# Patient Record
Sex: Female | Born: 1955
Health system: Southern US, Community
[De-identification: ages and names within clinical notes are randomized; demographics above are authoritative.]

## PROBLEM LIST (undated history)

## (undated) DIAGNOSIS — J189 Pneumonia, unspecified organism: Secondary | ICD-10-CM

## (undated) HISTORY — PX: NEPHRECTOMY: SHX65

## (undated) HISTORY — PX: EYE SURGERY: SHX253

---

## 2018-01-31 ENCOUNTER — Other Ambulatory Visit: Payer: Self-pay

## 2018-01-31 ENCOUNTER — Emergency Department (HOSPITAL_BASED_OUTPATIENT_CLINIC_OR_DEPARTMENT_OTHER)
Admission: EM | Admit: 2018-01-31 | Discharge: 2018-01-31 | Disposition: A | Discharge status: Home / Self Care | Payer: BC Managed Care – PPO | Attending: Physician Assistant | Admitting: Physician Assistant

## 2018-01-31 ENCOUNTER — Emergency Department (HOSPITAL_BASED_OUTPATIENT_CLINIC_OR_DEPARTMENT_OTHER): Payer: BC Managed Care – PPO

## 2018-01-31 ENCOUNTER — Encounter (HOSPITAL_BASED_OUTPATIENT_CLINIC_OR_DEPARTMENT_OTHER): Payer: Self-pay | Admitting: Emergency Medicine

## 2018-01-31 DIAGNOSIS — R071 Chest pain on breathing: Secondary | ICD-10-CM | POA: Insufficient documentation

## 2018-01-31 DIAGNOSIS — R079 Chest pain, unspecified: Secondary | ICD-10-CM | POA: Diagnosis present

## 2018-01-31 DIAGNOSIS — R0781 Pleurodynia: Secondary | ICD-10-CM

## 2018-01-31 HISTORY — DX: Pneumonia, unspecified organism: J18.9

## 2018-01-31 LAB — CBC WITH DIFFERENTIAL/PLATELET
Basophils Absolute: 0 10*3/uL (ref 0.0–0.1)
Basophils Relative: 1 %
Eosinophils Absolute: 0.2 10*3/uL (ref 0.0–0.7)
Eosinophils Relative: 3 %
HEMATOCRIT: 42.1 % (ref 36.0–46.0)
HEMOGLOBIN: 14.3 g/dL (ref 12.0–15.0)
LYMPHS ABS: 2 10*3/uL (ref 0.7–4.0)
Lymphocytes Relative: 32 %
MCH: 31.2 pg (ref 26.0–34.0)
MCHC: 34 g/dL (ref 30.0–36.0)
MCV: 91.7 fL (ref 78.0–100.0)
Monocytes Absolute: 0.7 10*3/uL (ref 0.1–1.0)
Monocytes Relative: 11 %
NEUTROS ABS: 3.4 10*3/uL (ref 1.7–7.7)
NEUTROS PCT: 53 %
Platelets: 238 10*3/uL (ref 150–400)
RBC: 4.59 MIL/uL (ref 3.87–5.11)
RDW: 12.8 % (ref 11.5–15.5)
WBC: 6.3 10*3/uL (ref 4.0–10.5)

## 2018-01-31 LAB — BASIC METABOLIC PANEL
Anion gap: 5 (ref 5–15)
BUN: 23 mg/dL — AB (ref 6–20)
CHLORIDE: 105 mmol/L (ref 101–111)
CO2: 26 mmol/L (ref 22–32)
Calcium: 9.5 mg/dL (ref 8.9–10.3)
Creatinine, Ser: 0.77 mg/dL (ref 0.44–1.00)
GFR calc Af Amer: >60 mL/min (ref >60)
Glucose, Bld: 95 mg/dL (ref 65–99)
POTASSIUM: 4.6 mmol/L (ref 3.5–5.1)
Sodium: 136 mmol/L (ref 135–145)

## 2018-01-31 LAB — D-DIMER, QUANTITATIVE (NOT AT ARMC): D DIMER QUANT: 0.52 ug{FEU}/mL — AB (ref 0.00–0.50)

## 2018-01-31 LAB — TROPONIN I: Troponin I: <0.03 ng/mL (ref <0.03)

## 2018-01-31 MED ORDER — DOXYCYCLINE HYCLATE 100 MG PO CAPS: 100.0000 mg | ORAL_CAPSULE | Freq: Two times a day (BID) | ORAL | 0 refills | Status: DC

## 2018-01-31 MED FILL — DOXYCYCLINE HYCLATE 100 MG: 100 | 7 days supply | Qty: 14 | Fill #0

## 2018-01-31 NOTE — Discharge Instructions (Signed)
Please read and follow all provided instructions.  Your diagnoses today include:  1. Pleuritic chest pain     Tests performed today include:  An EKG of your heart  A chest x-ray - no definite new pneumonia  Cardiac enzymes - a blood test for heart muscle damage was normal  Blood counts and electrolytes  Blood clot screening test  Vital signs. See below for your results today.   Medications prescribed:   Doxycycline - antibiotic  You have been prescribed an antibiotic medicine: take the entire course of medicine even if you are feeling better. Stopping early can cause the antibiotic not to work.  Take any prescribed medications only as directed.  Follow-up instructions: Please follow-up with your primary care provider next week for further evaluation of your symptoms.   Return instructions:  SEEK IMMEDIATE MEDICAL ATTENTION IF:  You have severe chest pain, especially if the pain is crushing or pressure-like and spreads to the arms, back, neck, or jaw, or if you have sweating, nausea (feeling sick to your stomach), or shortness of breath. THIS IS AN EMERGENCY. Don't wait to see if the pain will go away. Get medical help at once. Call 911 or 0 (operator). DO NOT drive yourself to the hospital.   Your chest pain gets worse and does not go away with rest.   You have an attack of chest pain lasting longer than usual, despite rest and treatment with the medications your caregiver has prescribed.   You wake from sleep with chest pain or shortness of breath.  You feel dizzy or faint.  You have chest pain not typical of your usual pain for which you originally saw your caregiver.   You have any other emergent concerns regarding your health.  Additional Information: Chest pain comes from many different causes. Your caregiver has diagnosed you as having chest pain that is not specific for one problem, but does not require admission.  You are at low risk for an acute heart  condition or other serious illness.   Your vital signs today were: BP (!) 142/81 (BP Location: Right Arm)    Pulse 64    Temp 98.1 F (36.7 C) (Oral)    Resp 16    Ht  (1.651 m)    Wt 59 kg (130 lb)    SpO2 100%    BMI 21.63 kg/m  If your blood pressure (BP) was elevated above 135/85 this visit, please have this repeated by your doctor within one month. --------------

## 2018-01-31 NOTE — ED Provider Notes (Signed)
[facility] EMERGENCY DEPARTMENT Provider Note   CSN: [id number] Arrival date & time: 01/31/18  1217     History   Chief Complaint Chief Complaint  Patient presents with  . Chest Pain    HPI Bailey West is a 62 y.o. female.  Patient with history of bronchiectasis presents with acute onset of sharp mid chest pain starting last night, greater  than 12 hours ago, that is much worse with deep breathing.  Patient states that she has had similar pain in the past when she has been diagnosed with pneumonia.  She says she is prone to pneumonia due to the underlying bronchiectasis.  She denies any associated fevers, URI symptoms, cough.  She does not feel particularly short of breath.  She states that her pain is not worsened when she pushes on the area.  She denies any injury to her chest wall.  No nausea, vomiting, or abdominal pain.  No urinary symptoms.  Patient had BMP drawn today which only showed a slightly high bicarbonate. Patient denies risk factors for pulmonary embolism including: unilateral leg swelling, history of DVT/PE/other blood clots, use of exogenous hormones, recent immobilizations, recent surgery, recent travel (>4hr segment), malignancy, hemoptysis.   Patient recently had an abnormal chest CT showing bronchiectasis and possible atypical pneumonia.  Patient's records have been faxed to the emergency department and I have reviewed these.  Patient has seen a pulmonologist who ruled out HIV and invasive fungal infection.  She had PFTs showing restrictive disease.  Patient states that she had was unable to have a sputum culture performed.  She has never really felt completely better after this infection.  Patient states that she is currently looking for a new pulmonary doctor through her primary care.     Past Medical History:  Diagnosis Date  . Pneumonia    recurrent     There are no active problems to display for this patient.   Past Surgical History:    Procedure Laterality Date  . EYE SURGERY Bilateral   . NEPHRECTOMY Left      OB History   None      Home Medications    Prior to Admission medications   Not on File    Family History No family history on file.  Social History Social History   Tobacco Use  . Smoking status: Never Smoker  . Smokeless tobacco: Never Used  Substance Use Topics  . Alcohol use: Never    Frequency: Never  . Drug use: Never     Allergies   Patient has no known allergies.   Review of Systems Review of Systems  Constitutional: Negative for diaphoresis and fever.  Eyes: Negative for redness.  Respiratory: Negative for cough and shortness of breath.   Cardiovascular: Positive for chest pain. Negative for palpitations and leg swelling.  Gastrointestinal: Negative for abdominal pain, nausea and vomiting.  Genitourinary: Negative for dysuria.  Musculoskeletal: Negative for back pain and neck pain.  Skin: Negative for rash.  Neurological: Negative for syncope and light-headedness.  Psychiatric/Behavioral: The patient is not nervous/anxious.      Physical Exam Updated Vital Signs BP (!) 147/83 (BP Location: Left Arm)   Pulse 69   Temp 98.1 F (36.7 C) (Oral)   Resp 18   Ht  (1.651 m)   Wt 59 kg (130 lb)   SpO2 99%   BMI 21.63 kg/m   Physical Exam  Constitutional: She appears well-developed and well-nourished.  HENT:  Head: Normocephalic and  atraumatic.  Mouth/Throat: Oropharynx is clear and moist.  Eyes: Conjunctivae are normal. Right eye exhibits no discharge. Left eye exhibits no discharge.  Neck: Normal range of motion. Neck supple.  Cardiovascular: Normal rate, regular rhythm and normal heart sounds.  No murmur heard. Pulmonary/Chest: Effort normal and breath sounds normal. No respiratory distress. She has no wheezes. She has no rales.  Abdominal: Soft. There is no tenderness. There is no rebound and no guarding.  Musculoskeletal: She exhibits no edema.   Neurological: She is alert.  Skin: Skin is warm and dry.  Psychiatric: She has a normal mood and affect.  Nursing note and vitals reviewed.    ED Treatments / Results  Labs (all labs ordered are listed, but only abnormal results are displayed) Labs Reviewed  BASIC METABOLIC PANEL - Abnormal; Notable for the following components:      Result Value   BUN 23 (*)    All other components within normal limits  D-DIMER, QUANTITATIVE (NOT AT [facility]) - Abnormal; Notable for the following components:   D-Dimer, Quant 0.52 (*)    All other components within normal limits  CBC WITH DIFFERENTIAL/PLATELET  TROPONIN I   ED ECG REPORT   Date: 01/31/2018  Rate: 71  Rhythm: normal sinus rhythm  QRS Axis: right  Intervals: normal  ST/T Wave abnormalities: normal  Conduction Disutrbances:none  Narrative Interpretation:   Old EKG Reviewed: none available  I have personally reviewed the EKG tracing and agree with the computerized printout as noted.  Radiology Dg Chest 2 View  Result Date: 01/31/2018 CLINICAL DATA:  Left-sided chest pain EXAM: CHEST - 2 VIEW COMPARISON:  10/31/2017 FINDINGS: Cardiac shadow is within normal limits. Postsurgical changes in the upper abdomen are seen. The lungs are well aerated bilaterally. Few scattered calcified granulomas are seen. Stable scarring in the right middle lobe is noted. No acute infiltrate or sizable effusion is noted. No acute bony abnormality is seen. IMPRESSION: Chronic changes without acute abnormality Electronically Signed   By: Alcide Clever M.D.   On: 01/31/2018 13:42    Procedures Procedures (including critical care time)  Medications Ordered in ED Medications - No data to display   Initial Impression / Assessment and Plan / ED Course  I have reviewed the triage vital signs and the nursing notes.  Pertinent labs & imaging results that were available during my care of the patient were reviewed by me and considered in my medical decision  making (see chart for details).     Patient seen and examined. EKG reviewed. Records reviewed.   Vital signs reviewed and are as follows: BP (!) 147/83 (BP Location: Left Arm)   Pulse 69   Temp 98.1 F (36.7 C) (Oral)   Resp 18   Ht  (1.651 m)   Wt 59 kg (130 lb)   SpO2 99%   BMI 21.63 kg/m   Patient states previous pains with episodes of pneumonia.  Her chest x-ray today does not show an overt acute pneumonia.  She does not have readily reproducible chest wall pain.  Symptoms are typical for ACS.  I discussed screening for PE with the patient and if d-dimer is positive the need for chest CT.  She agrees to proceed with this work-up as we do not have a other clear cause of patient's symptoms.  Vital signs are normal.  Patient is in no respiratory distress.  3:45 PM Work-up complete. CBC is normal. BMP unremarkable. Normal trop. D-dimer slightly elevated over lab cut-off  but still less than age-adjusted d-dimer of 0.62.  Patient does not have any vital sign changes or clinical signs and symptoms of DVT.  With this d-dimer, I feel patient is of sufficiently low risk for PE that additional work-up does not need to be undertaken.  Discussed these results with patient at bedside.  Given her chronic lung disease, and previous similar pain in setting of pneumonia, will provide patient with course of antibiotics to take if she feels worse.  She is to follow-up with her primary care physician early next week to be rechecked.  We discussed signs and symptoms which cause her to return sooner including worsening shortness of breath, worsening pain, fever, trouble breathing or increased work of breathing.  Patient verbalizes understanding and seems willing to return with worsening.  Final Clinical Impressions(s) / ED Diagnoses   Final diagnoses:  Pleuritic chest pain   Discussed as above. Work-up reassuring. NSAIDs/Tylenol for pain. F/u with PCP next week. Low concern for ACS/PE/dissection. No  back pain, limb, or neuro features.   ED Discharge Orders        Ordered    doxycycline (VIBRAMYCIN) 100 MG capsule  2 times daily     01/31/18 1549       Renne Crigler, PA-C 01/31/18 1550    Mackuen, Cindee Salt, MD 02/02/18 [phone]

## 2018-01-31 NOTE — ED Triage Notes (Signed)
Pain to left chest since last night. Worse with  Taking a deep breath. Recent pneumonia

## 2019-02-05 ENCOUNTER — Other Ambulatory Visit: Payer: Self-pay

## 2019-02-05 ENCOUNTER — Ambulatory Visit: Payer: BC Managed Care – PPO | Admitting: Internal Medicine

## 2019-02-05 ENCOUNTER — Encounter: Payer: Self-pay | Admitting: Internal Medicine

## 2019-02-05 VITALS — BP 110/80 | HR 82 | Temp 97.9°F | Ht 65.5 in | Wt 137.8 lb

## 2019-02-05 DIAGNOSIS — J479 Bronchiectasis, uncomplicated: Secondary | ICD-10-CM | POA: Diagnosis not present

## 2019-02-05 LAB — CBC WITH DIFFERENTIAL/PLATELET
Basophils Absolute: 0.1 10*3/uL (ref 0.0–0.1)
Basophils Relative: 0.6 % (ref 0.0–3.0)
Eosinophils Absolute: 0.2 10*3/uL (ref 0.0–0.7)
Eosinophils Relative: 1.7 % (ref 0.0–5.0)
HCT: 39.7 % (ref 36.0–46.0)
Hemoglobin: 13.4 g/dL (ref 12.0–15.0)
Lymphocytes Relative: 23.4 % (ref 12.0–46.0)
Lymphs Abs: 2.1 10*3/uL (ref 0.7–4.0)
MCHC: 33.9 g/dL (ref 30.0–36.0)
MCV: 92.8 fl (ref 78.0–100.0)
Monocytes Absolute: 0.9 10*3/uL (ref 0.1–1.0)
Monocytes Relative: 10.3 % (ref 3.0–12.0)
Neutro Abs: 5.8 10*3/uL (ref 1.4–7.7)
Neutrophils Relative %: 64 % (ref 43.0–77.0)
Platelets: 247 10*3/uL (ref 150.0–400.0)
RBC: 4.28 Mil/uL (ref 3.87–5.11)
RDW: 13.8 % (ref 11.5–15.5)
WBC: 9 10*3/uL (ref 4.0–10.5)

## 2019-02-05 MED ORDER — LEVOFLOXACIN 500 MG PO TABS: 500.0000 mg | ORAL_TABLET | Freq: Every day | ORAL | 0 refills | Status: DC

## 2019-02-05 NOTE — Patient Instructions (Addendum)
Bronchiectasis =   you have scarring of your bronchial tubes which means that they don't function perfectly normally and mucus tends to pool in certain areas of your lung which can cause pneumonia and further scarring of your lung and bronchial tubes  Whenever you develop cough congestion take mucinex or mucinex dm > these will help keep the mucus loose and flowing but if your condition worsens you need to seek help immediately preferably here or somewhere inside the [facility] system to compare xrays ( worse = darker or bloody mucus or pain on breathing in)     Levaquin 500 mg daily x 10 days - stop if aches in tendons    Please remember to go to the lab department   for your tests - we will call you with the results when they are available.  You will need both pneumovax and Prevar 13 but needs to be done per guidelines       Please schedule a follow up office visit in 6 weeks, call sooner if needed with pfts first

## 2019-02-05 NOTE — Progress Notes (Signed)
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       History of Present Illness  02/05/2019  Pulmonary/ 1st office eval/Bailey West  Chief Complaint  Patient presents with  . Consult    positive MAC, cough, SOB with exertion, lungs hurt at night  Dyspnea:  Walking up to 10-15 min slt incline slows her down = MMRC1 = can walk nl pace, flat grade, can't hurry or go uphills or steps s sob   Cough: cough when gets up/ really not productive / no more blood p d/c all anticoagulants   Sleep: on side / bed is horizontal with one pillow / no am flare  SABA use: none  Chronic ant R Cp is to right of midline/ seems worse p supper or with deep breath x year or two really doesn't flare with episodes of "bronchitis"  No obvious patterns in day to day or daytime variability or assoc excess/ purulent sputum or mucus plugs or hemoptysis or cp or chest tightness, subjective wheeze or overt sinus or hb symptoms.   Sleeping flat now s  without nocturnal  or early am exacerbation  of respiratory  c/o's or need for noct saba. Also denies any obvious fluctuation of symptoms with weather or environmental changes or other aggravating or alleviating factors except as outlined above   No unusual exposure hx or h/o childhood pna/ asthma or  knowledge of premature birth.  Current Allergies, Complete Past Medical History, Past Surgical History, Family History, and Social History were reviewed in [employer] record.  ROS  The following are not active complaints unless bolded Hoarseness, sore throat, dysphagia, dental problems, itching, sneezing,  nasal congestion or discharge of excess mucus or purulent secretions, ear ache,   fever, chills, sweats, unintended wt loss or wt gain, classically pleuritic or exertional cp,  orthopnea pnd or arm/hand swelling  or leg swelling, presyncope, palpitations, abdominal pain, anorexia, nausea, vomiting, diarrhea  or change in bowel habits or change in bladder habits, change in stools or change in urine, dysuria, hematuria,  rash, arthralgias, visual complaints, headache, numbness, weakness or ataxia or problems with walking or coordination,  change in mood or  memory.            Past Medical History:  Diagnosis Date  . Pneumonia    recurrent           Objective:     BP 110/80 (BP Location: Left Arm, Cuff Size: Normal)   Pulse 82   Temp 97.9 F (36.6 C)   Ht 5' 5.5" (1.664 m)   Wt 137 lb 12.8 oz (62.5 kg)   SpO2 98%   BMI 22.58 kg/m   SpO2: 98 % RA  Thin wf nad   HEENT: nl dentition / oropharynx. Nl external ear canals without cough reflex -  Mild  bilateral non-specific turbinate edema     NECK :  without JVD/Nodes/TM/ nl carotid upstrokes bilaterally   LUNGS: no acc muscle use,  Mild barrel  contour chest wall with bilateral  Distant bs s audible wheeze and  without cough on insp or exp maneuver and mild  Hyperresonant  to  percussion bilaterally     CV:  RRR  no s3 or murmur or increase in P2, and no edema   ABD:  soft and nontender with pos late  insp Hoover's  in the supine position. No bruits or organomegaly appreciated, bowel sounds nl  MS:   Nl gait/  ext warm without deformities, calf tenderness, cyanosis or clubbing No obvious joint  restrictions   SKIN: warm and dry without lesions    NEURO:  alert, approp, nl sensorium with  no motor or cerebellar deficits apparent.        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, and no evidence of superimposed acute cardiopulmonary Disease   Lab Results  Component Value Date   WBC 9.0 02/05/2019   HGB 13.4 02/05/2019   HCT 39.7 02/05/2019   MCV 92.8 02/05/2019   PLT 247.0 02/05/2019       EOS                                                              0.2                                    02/05/2019   Labs ordered 02/05/2019  Quant Ig's, alpha one AT screen      Assessment   Bronchiectasis without complication ([place]) Onset of symptoms around 2010  - Confirmed on CT 11/04/17 esp rml/lingula with tree in bud changes - Sputum 12/30/18 2+ smear, 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    For now will just follow with plain films and clinical response to short courses of levquin to judge reversibility / whether condition is progressive and if so seek ID opinion on best rx options for up to 3 drugs for up to 3 years depending on the sensitivities to yet be determined.   Discussed pathophysiology of bronchiectasis using escalator analogy.   Offered mucinex/mucinex dm for now and should mucus clearance become more of a problem add  flutter then vest using the KIS principle for now.  Discussed in detail all the  indications, usual  risks and alternatives  relative to  the benefits with patient who agrees to proceed with conservative f/u as outlined     >>>  will need full pnemovax rx and one dose prevnar 13 as per timing guidelines per PCP and f/u here in 6 weeks with pfts assuming by then  COVID - 19 restrictions have been lifted.       Total time devoted to counseling  > 50 % of initial 60 min office visit:  review case with pt/ discussion of options/alternatives/ personally creating written customized instructions  in presence of pt  then going over those specific  Instructions directly with the pt including how to use all of the meds but in particular covering each new medication in detail and the difference between the maintenance= "automatic" meds and the prns using an action plan format for the latter (If this problem/symptom => do that organization reading Left to right).  Please see AVS from this visit for a full list of these instructions which I personally wrote for this pt and  are unique to this visit.      Christinia Gully, MD 02/05/2019

## 2019-02-05 NOTE — Assessment & Plan Note (Addendum)
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    For now will just follow with plain films and clinical response to short courses of levquin to judge reversibility / whether condition is progressive and if so seek ID opinion on best rx options for up to 3 drugs for up to 3 years depending on the sensitivities to yet be determined.   Discussed pathophysiology of bronchiectasis using escalator analogy.   Offered mucinex/mucinex dm for now and should mucus clearance become more of a problem add flutter then vest using the KIS principle for now.  Discussed in detail all the  indications, usual  risks and alternatives  relative to the benefits with patient who agrees to proceed with conservative f/u as outlined     >> will need full pnemovax rx and one dose prevnar 13 as per timing guidelines per PCP and f/u here in 6 weeks with pfts assuming by then  COVID - 19 restrictions have been lifted.    Total time devoted to counseling  > 50 % of initial 60 min office visit:  review case with pt/ discussion of options/alternatives/ personally creating written customized instructions  in presence of pt  then going over those specific  Instructions directly with the pt including how to use all of the meds but in particular covering each new medication in detail and the difference  between the maintenance= "automatic" meds and the prns using an action plan format for the latter (If this problem/symptom => do that organization reading Left to right).  Please see AVS from this visit for a full list of these instructions which I personally wrote for this pt and  are unique to this visit.

## 2019-02-09 NOTE — Progress Notes (Signed)
Spoke with pt and notified of results per Dr. Wert. Pt verbalized understanding and denied any questions. 

## 2019-02-11 LAB — RESPIRATORY ALLERGY PROFILE REGION II ~~LOC~~
Allergen, A. alternata, m6: <0.1 kU/L
Allergen, Cedar tree, t12: 0.13 kU/L — ABNORMAL HIGH
Allergen, Comm Silver Birch, t9: <0.1 kU/L
Allergen, Cottonwood, t14: <0.1 kU/L
Allergen, D pternoyssinus,d7: 0.2 kU/L — ABNORMAL HIGH
Allergen, Mouse Urine Protein, e78: <0.1 kU/L
Allergen, Mulberry, t76: <0.1 kU/L
Allergen, Oak,t7: <0.1 kU/L
Allergen, P. notatum, m1: <0.1 kU/L
Aspergillus fumigatus, m3: <0.1 kU/L
Bermuda Grass: <0.1 kU/L
Box Elder IgE: <0.1 kU/L
CLADOSPORIUM HERBARUM (M2) IGE: <0.1 kU/L
COMMON RAGWEED (SHORT) (W1) IGE: <0.1 kU/L
Cat Dander: 0.15 kU/L — ABNORMAL HIGH
Class: 0
Class: 0
Class: 0
Class: 0
Class: 0
Class: 0
Class: 0
Class: 0
Class: 0
Class: 0
Class: 0
Class: 0
Class: 0
Class: 0
Class: 0
Class: 0
Class: 0
Class: 0
Class: 0
Class: 0
Class: 0
Class: 0
Class: 0
Class: 0
Cockroach: <0.1 kU/L
D. farinae: 0.17 kU/L — ABNORMAL HIGH
Dog Dander: 0.28 kU/L — ABNORMAL HIGH
Elm IgE: <0.1 kU/L
IgE (Immunoglobulin E), Serum: 229 kU/L — ABNORMAL HIGH (ref <=114)
Johnson Grass: <0.1 kU/L
Pecan/Hickory Tree IgE: <0.1 kU/L
Rough Pigweed  IgE: <0.1 kU/L
Sheep Sorrel IgE: <0.1 kU/L
Timothy Grass: <0.1 kU/L

## 2019-02-11 LAB — IGG, IGA, IGM
IgG (Immunoglobin G), Serum: 1079 mg/dL (ref 600–1540)
IgM, Serum: 121 mg/dL (ref 50–300)
Immunoglobulin A: 272 mg/dL (ref 70–320)

## 2019-02-11 LAB — ALPHA-1 ANTITRYPSIN PHENOTYPE: A-1 Antitrypsin, Ser: 133 mg/dL (ref 83–199)

## 2019-02-11 LAB — INTERPRETATION:

## 2019-02-17 ENCOUNTER — Telehealth: Payer: Self-pay | Admitting: Internal Medicine

## 2019-02-17 NOTE — Telephone Encounter (Signed)
Called and spoke with pt stating to her the info from [facility] and pt expressed understanding. Pt stated that she is beginning to be able to raise her arms but the feeling is still there in both her arms and legs. Stated to pt if still having problems within the next couple days that she needs to call PCP to make sure there is nothing else going on and pt verbalized understanding. Nothing further needed.

## 2019-02-17 NOTE — Telephone Encounter (Signed)
It is possible it was a reaction to levaquin so she did the right thing but may need to take it in the future so if feeling all better great, if not they needs pcp to eval the muscle problem as it may not be related and don't want to preclude future use based on iffy sideffects but will avoid if at all possible

## 2019-02-17 NOTE — Telephone Encounter (Signed)
Spoke with patient. She was seen by MW on 02/05/19 and was prescribed Levaquin 500 once daily for 10 days. She actually started the medication on 02/06/19. By 02/14/19 (this past Saturday), she stated that both arms felt extremely heavy and both knees were stiff. She thought maybe she had pulled a muscle but remember that she has not been that active in the past few weeks.   Her last dose was scheduled for Sunday 02/15/19 but she skipped this dose due to the stiffness and muscle soreness.   She wants to know if it is possible that the stiffness and soreness came from the Levaquin. She did state that even though she did not completely finish the antibiotic, she is feeling a lot better. Denies any symptoms of feeling like her throat was closing up or numbness.   MW, please advise. Thanks!

## 2019-02-19 ENCOUNTER — Other Ambulatory Visit: Payer: Self-pay | Admitting: Internal Medicine

## 2019-03-16 ENCOUNTER — Other Ambulatory Visit (HOSPITAL_COMMUNITY)
Admission: RE | Admit: 2019-03-16 | Discharge: 2019-03-16 | Disposition: A | Discharge status: Home / Self Care | Payer: BC Managed Care – PPO | Source: Ambulatory Visit | Attending: Internal Medicine | Admitting: Internal Medicine

## 2019-03-16 DIAGNOSIS — Z1159 Encounter for screening for other viral diseases: Secondary | ICD-10-CM | POA: Insufficient documentation

## 2019-03-17 LAB — SARS CORONAVIRUS 2 (TAT 6-24 HRS): SARS Coronavirus 2: NEGATIVE

## 2019-03-18 ENCOUNTER — Other Ambulatory Visit: Payer: Self-pay | Admitting: *Deleted

## 2019-03-18 ENCOUNTER — Other Ambulatory Visit: Payer: Self-pay | Admitting: Internal Medicine

## 2019-03-18 DIAGNOSIS — J479 Bronchiectasis, uncomplicated: Secondary | ICD-10-CM

## 2019-03-19 ENCOUNTER — Ambulatory Visit: Payer: BC Managed Care – PPO | Admitting: Acute Care

## 2019-03-19 ENCOUNTER — Ambulatory Visit: Payer: BC Managed Care – PPO | Admitting: Internal Medicine

## 2019-03-19 ENCOUNTER — Ambulatory Visit (INDEPENDENT_AMBULATORY_CARE_PROVIDER_SITE_OTHER): Payer: BC Managed Care – PPO | Admitting: Internal Medicine

## 2019-03-19 ENCOUNTER — Encounter: Payer: Self-pay | Admitting: Acute Care

## 2019-03-19 ENCOUNTER — Other Ambulatory Visit: Payer: Self-pay

## 2019-03-19 VITALS — BP 124/80 | HR 73 | Ht 62.75 in | Wt 138.6 lb

## 2019-03-19 DIAGNOSIS — J479 Bronchiectasis, uncomplicated: Secondary | ICD-10-CM | POA: Diagnosis not present

## 2019-03-19 DIAGNOSIS — J309 Allergic rhinitis, unspecified: Secondary | ICD-10-CM | POA: Insufficient documentation

## 2019-03-19 DIAGNOSIS — J449 Chronic obstructive pulmonary disease, unspecified: Secondary | ICD-10-CM | POA: Diagnosis not present

## 2019-03-19 DIAGNOSIS — T50905A Adverse effect of unspecified drugs, medicaments and biological substances, initial encounter: Secondary | ICD-10-CM | POA: Insufficient documentation

## 2019-03-19 DIAGNOSIS — J302 Other seasonal allergic rhinitis: Secondary | ICD-10-CM | POA: Diagnosis not present

## 2019-03-19 LAB — PULMONARY FUNCTION TEST
DL/VA % pred: 118 %
DL/VA: 5 ml/min/mmHg/L
DLCO cor % pred: 96 %
DLCO cor: 18.5 ml/min/mmHg
DLCO unc % pred: 96 %
DLCO unc: 18.5 ml/min/mmHg
FEF 25-75 Post: 1.26 L/sec
FEF 25-75 Pre: 0.84 L/sec
FEF2575-%Change-Post: 50 %
FEF2575-%Pred-Post: 58 %
FEF2575-%Pred-Pre: 38 %
FEV1-%Change-Post: 11 %
FEV1-%Pred-Post: 71 %
FEV1-%Pred-Pre: 64 %
FEV1-Post: 1.68 L
FEV1-Pre: 1.51 L
FEV1FVC-%Change-Post: 2 %
FEV1FVC-%Pred-Pre: 87 %
FEV6-%Change-Post: 7 %
FEV6-%Pred-Post: 81 %
FEV6-%Pred-Pre: 75 %
FEV6-Post: 2.41 L
FEV6-Pre: 2.24 L
FEV6FVC-%Change-Post: 0 %
FEV6FVC-%Pred-Post: 103 %
FEV6FVC-%Pred-Pre: 103 %
FVC-%Change-Post: 8 %
FVC-%Pred-Post: 78 %
FVC-%Pred-Pre: 73 %
FVC-Post: 2.42 L
FVC-Pre: 2.24 L
Post FEV1/FVC ratio: 69 %
Post FEV6/FVC ratio: 100 %
Pre FEV1/FVC ratio: 67 %
Pre FEV6/FVC Ratio: 100 %
RV % pred: 134 %
RV: 2.68 L
TLC % pred: 99 %
TLC: 4.87 L

## 2019-03-19 MED ORDER — BUDESONIDE-FORMOTEROL FUMARATE 80-4.5 MCG/ACT IN AERO: 2.0000 | INHALATION_SPRAY | Freq: Two times a day (BID) | RESPIRATORY_TRACT | 0 refills | Status: DC

## 2019-03-19 NOTE — Patient Instructions (Addendum)
It is good to see you today. We will document in the medical record that you cannot take Levaquin due to tendonitis. Whenever you develop cough congestion take mucinex or mucinex dm > these will help keep the mucus loose and flowing but if your condition worsens you need to seek help immediately preferably here or somewhere inside the [facility] system to compare xrays ( worse = darker or bloody mucus or pain on breathing in)   Your Pulmonary Function Test shows you may have an element of asthma that is affecting you breathing.  We will do a therapeutic trial with Symbicort 80 Take 2 puff in the morning and 2 puffs at night Rinse your mouth/ brush your teeth  after use We will teach you how to use the inhaler. Follow up with Dr. Melvyn Novas or Judson Roch NP in 2 weeks to evaluate if you feel the Symbicort has improved your breathing.  If you feel chest congestion, please start Mucinex . Take this with a full glass of water. This should help you cough up the mucus in your lungs. Call us if you are unable to get up mucus with the mucinex. We can add a flutter valve with the mucinex at that time. If you develop fever with cough call the office, we may need to check a CXR. Consider adding Claritin, for the post nasal drip you are experiencing.Generic is ok. You could also try Flonase nasal spray ( generic is Fluticasone)  2 sprays  up each nostril once daily. If you see blood in your secretions call the office or seek emergency care. Please contact office for sooner follow up if symptoms do not improve or worsen or seek emergency care       Bronchiectasis =   you have scarring of your bronchial tubes which means that they don't function perfectly normally and mucus tends to pool in certain areas of your lung which can cause pneumonia and further scarring of your lung and bronchial tubes

## 2019-03-19 NOTE — Assessment & Plan Note (Addendum)
Patient developed tendinitis from Levaquin affecting her ability to lift her arms and to stand. She did not call the office and let us know. She stopped taking Levaquin after the 9th  day of therapy. Symptoms completely resolved after 4 days Plan Levaquin has been entered into epic as a drug allergy Patient has been instructed to make anyone prescribing her antibiotics aware that she is not to take [place]. I explained that as long she is within the [facility]  system it  Is well documented but if she goes out of the [facility]  system for care she needs to verbally let providers know She verbalized understanding

## 2019-03-19 NOTE — Progress Notes (Signed)
History of Present Illness Bailey West is a 63 y.o. female former remote smoker ( Quit 6) with bronchiectasis and MAC. PFT's indicated an asthmatic component and moderate obstruction.She is followed by Dr. Melvyn Novas.  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  Allergy and respiratory profile Studies are unremarkable x for mod allergies to cedar trees, dog, cats dust > rec avoidance at present time.She does have a small dog.   03/19/2019  Pt.presents for follow up. She was initially seen by Dr. Melvyn Novas 01/2019. His plan was to follow with plain films and clinical response to short courses of levquin to judge reversibility / whether condition is progressive and if so seek ID opinion on best rx options for up to 3 drugs for up to 3 years depending on the sensitivities to yet be determined. Plan is for mucinex / mucinex dm for now, with addition of flutter valve and vest should mucus clearance become more problematic.   Dr. Melvyn Novas started her on Levaquin for 10 days.. She developed tendonitis and took 9 of the 10 days. She could not  Lift her arms , and her knees were so stiff she could not stand. She did not take the last dose. These symptoms cleared within 4 days of discontinuing the medication. We have documented this as an ADR in Epic. I have told the patient to tell anyone  prescribing her antibiotics that she has an allergy to Levaquin. She states she felt better after her completion of 9 days of Levaquin. She states she still cannot cough anything up.She has not been using Mucinex as she states  does not have chest congestion at present. We discussed at length when to start Mucinex and when to call the office. Likewise we discussed to call the office for any blood in her secretions or any change in secretion color. She verbalized understanding .She denies fever, chest pain, orthopnea or hemoptysis.  Pt.  will need full pnemovax rx and one dose prevnar 13 as per timing guidelines per PCP at follow up visit  Test Results: PFT's 03/19/2019 FVC: Actual 2.24 space predicted 3.07 space percent predicted 73-- postbronchodilator actual 2.42% predicted 78% change +8 FEV1: Actual 1.51 space predicted 2.36 space percent predicted 64%-- postbronchodilator actual 1.68, percent predicted 71, percent change +11 FF ratio: Actual 67%, predicted 77%, percent predicted 87%-- postbronchodilator actual 69, percent predicted 89, percent change +2 FEF 25 to 75%: Actual 0.84 predicted 2.15% predicted 38, postbronchodilator actual 1.26, percent predicted 58, percent change + 50 DLCO: Actual 18.5 predicted 19.15% predicted 96 Moderate COPD with asthmatic component, some + bronchodilator effect  CBC Latest Ref Rng & Units 02/05/2019 01/31/2018  WBC 4.0 - 10.5 K/uL 9.0 6.3  Hemoglobin 12.0 - 15.0 g/dL 13.4 14.3  Hematocrit 36.0 - 46.0 % 39.7 42.1  Platelets 150.0 -  400.0 K/uL 247.0 238    BMP Latest Ref Rng & Units 01/31/2018  Glucose 65 - 99 mg/dL 95  BUN 6 - 20 mg/dL 23(H)  Creatinine 0.44 - 1.00 mg/dL 0.77  Sodium 135 - 145 mmol/L 136  Potassium 3.5 - 5.1 mmol/L 4.6  Chloride 101 - 111 mmol/L 105  CO2 22 - 32 mmol/L 26  Calcium 8.9 - 10.3 mg/dL 9.5    BNP No results found for: BNP  ProBNP No results found for: PROBNP  PFT    Component Value Date/Time   FEV1PRE 1.51  03/19/2019 0856   FEV1POST 1.68 03/19/2019 0856   FVCPRE 2.24 03/19/2019 0856   FVCPOST 2.42 03/19/2019 0856   TLC 4.87 03/19/2019 0856   DLCOUNC 18.50 03/19/2019 0856   PREFEV1FVCRT 67 03/19/2019 0856   PSTFEV1FVCRT 69 03/19/2019 0856    No results found.   Past medical hx Past Medical History:  Diagnosis Date  . Pneumonia    recurrent      Social History   Tobacco Use  . Smoking status: Former Smoker    Packs/day: 0.25    Types: Cigarettes  . Smokeless tobacco: Never Used  . Tobacco comment: 2 years 30 years ago  Substance Use Topics  . Alcohol use: Never    Frequency: Never  . Drug use: Never    Ms.Bean reports that she has quit smoking. Her smoking use included cigarettes. She smoked 0.25 packs per day. She has never used smokeless tobacco. She reports that she does not drink alcohol or use drugs.  Tobacco Cessation: Former smoker, smoked x2 years, quit 1990  Past surgical hx, Family hx, Social hx all reviewed.  No current outpatient medications on file prior to visit.   No current facility-administered medications on file prior to visit.      Allergies  Allergen Reactions  . Levaquin [Levofloxacin] Other (See Comments)    Tendonitis    Review Of Systems:  Constitutional:   No  weight loss, night sweats,  Fevers, chills, fatigue, or  lassitude.  HEENT:   No headaches,  Difficulty swallowing,  Tooth/dental problems, or  Sore throat,                No sneezing, itching, ear ache, nasal congestion, post nasal drip,   CV:  No chest pain,  Orthopnea, PND, swelling in lower extremities, anasarca, dizziness, palpitations, syncope.   GI  No heartburn, indigestion, abdominal pain, nausea, vomiting, diarrhea, change in bowel habits, loss of appetite, bloody stools.   Resp: + shortness of breath with exertion none at rest.  No excess mucus, no productive cough,  No non-productive cough,  No coughing up of blood.  No change in color of mucus.  +  Wheezing at  times with exertion.  No chest wall deformity  Skin: no rash or lesions.  GU: no dysuria, change in color of urine, no urgency or frequency.  No flank pain, no hematuria   MS:  No joint pain or swelling.  No decreased range of motion.  No back pain.  Psych:  No change in mood or affect. No depression or anxiety.  No memory loss.   Vital Signs BP 124/80 (BP Location: Left Arm, Cuff Size: Normal)   Pulse 73   Ht 5' 2.75" (1.594 m)   Wt 138 lb 9.6 oz (62.9 kg)   SpO2 98%   BMI 24.75 kg/m    Physical Exam:  General- No distress,  A&Ox3, pleasant ENT: No sinus tenderness, TM  clear, pale nasal mucosa, no oral exudate,no post nasal drip, no LAN Cardiac: S1, S2, regular rate and rhythm, no murmur Chest: No wheeze/ rales/ dullness; no accessory muscle use, no nasal flaring, no sternal retractions Abd.: Soft Non-tender, nondistended, non tender, Body mass index is 24.75 kg/m. 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 Whenever you develop cough congestion take mucinex or mucinex dm > these will help keep the mucus loose and flowing but if your condition worsens you need to seek help immediately preferably here or somewhere inside the [facility] system to compare xrays ( worse = darker or bloody mucus or pain on breathing in)   Your Pulmonary Function Test shows you may have an element of asthma that could be contributing to your exertional shortness of  breath We will do a therapeutic trial with Symbicort 80 Take 2 puffs in the morning and 2 puffs at night Rinse your mouth/ brush your teeth  after use We will teach you how to use the inhaler. Follow up with Dr. Melvyn Novas or Judson Roch NP in 2 weeks to evaluate if you feel the Symbicort has improved your breathing.  If you feel chest congestion, please start Mucinex . Take this with a full glass of water. This should help you cough up the mucus in your lungs. Call us if you are unable to get up mucus with the mucinex. We can add a flutter valve with the mucinex at that time. If you develop fever with cough call the office, we may need to check a CXR. If you see blood in your secretions call the office or seek emergency care. Please contact office for sooner follow up if symptoms do not improve or worsen or seek emergency care  I have spoken to Dr. Melvyn Novas, and he is considering performing a bronchoscopy for sputum culture, Gram stain, AFB, and fungal with sensitivities to allow for optimal antimicrobial treatment with flares. Patient will need full Pneumovax in 1 dose Prevnar 13 as per timing guidelines per PCP at follow-up visit Please contact office for sooner follow up if symptoms do not improve or worsen or seek emergency care   Adverse drug reaction, initial encounter Patient developed tendinitis from Levaquin affecting her ability to lift her arms and to stand. She did not call the office and let us know. She stopped taking Levaquin after the 9th  day of therapy. Symptoms completely resolved after 4 days Plan Levaquin has been entered into epic as a drug allergy Patient has been instructed to make anyone prescribing her antibiotics aware that she is not to take [place]. I explained that as long she is within the [facility]  system it  Is well documented but if she goes out of the [facility]  system for care she needs to verbally let providers know She verbalized understanding  Allergic rhinitis  Consider adding Claritin, for the post nasal drip you are experiencing.Generic is ok. Continue using nasal saline as you have been doing You could also  try Flonase nasal spray ( generic is Fluticasone)  2 sprays  up each nostril once daily.  PFT's with Moderate airwat obstruction with asthmatic component Plan Therapeutic trial of Symbicort 2 puffs twice daily Rinse mouth after use Call the office and let us know if this has improved your shortness of breath, so we can send in a prescription  Magdalen Spatz, NP 03/19/2019  6:31 PM

## 2019-03-19 NOTE — Assessment & Plan Note (Signed)
Patient states she feels better after treatment with 9 out of 10 days of Levaquin>> stopped due to tendinitis Patient states she does not cough secretions up at present Currently no chest congestion Currently not taking Mucinex Plan We will document in the medical record that you cannot take Levaquin due to tendonitis. Whenever you develop cough congestion take mucinex or mucinex dm > these will help keep the mucus loose and flowing but if your condition worsens you need to seek help immediately preferably here or somewhere inside the [facility] system to compare xrays ( worse = darker or bloody mucus or pain on breathing in)   Your Pulmonary Function Test shows you may have an element of asthma that could be contributing to your exertional shortness of breath We will do a therapeutic trial with Symbicort 80 Take 2 puffs in the morning and 2 puffs at night Rinse your mouth/ brush your teeth  after use We will teach you how to use the inhaler. Follow up with Dr. Melvyn Novas or Judson Roch NP in 2 weeks to evaluate if you feel the Symbicort has improved your breathing.  If you feel chest congestion, please start Mucinex . Take this with a full glass of water. This should help you cough up the mucus in your lungs. Call us if you are unable to get up mucus with the mucinex. We can add a flutter valve with the mucinex at that time. If you develop fever with cough call the office, we may need to check a CXR. If you see blood in your secretions call the office or seek emergency care. Please contact office for sooner follow up if symptoms do not improve or worsen or seek emergency care  I have spoken to Dr. Melvyn Novas, and he is considering performing a bronchoscopy for sputum culture, Gram stain, AFB, and fungal with sensitivities to allow for optimal antimicrobial treatment with flares. Patient will need full Pneumovax in 1 dose Prevnar 13 as per timing guidelines per PCP at follow-up visit Please contact office for sooner  follow up if symptoms do not improve or worsen or seek emergency care

## 2019-03-19 NOTE — Progress Notes (Signed)
PFT done today. 

## 2019-03-19 NOTE — Assessment & Plan Note (Signed)
Consider adding Claritin, for the post nasal drip you are experiencing.Generic is ok. Continue using nasal saline as you have been doing You could also try Flonase nasal spray ( generic is Fluticasone)  2 sprays  up each nostril once daily.

## 2019-03-20 ENCOUNTER — Encounter: Payer: Self-pay | Admitting: Acute Care

## 2019-03-25 ENCOUNTER — Telehealth: Payer: Self-pay

## 2019-03-25 DIAGNOSIS — J479 Bronchiectasis, uncomplicated: Secondary | ICD-10-CM

## 2019-03-25 NOTE — Telephone Encounter (Signed)
-----   Message from Magdalen Spatz, NP sent at 03/23/2019  9:47 AM EDT ----- Regarding: Refer to [facility], Can we please place an ID consult for this patient?  Dr. Melvyn Novas would like for them to be involved in her treatment. Thanks ----- Message ----- From: Tanda Rockers, MD Sent: 03/19/2019  11:19 AM EDT To: Magdalen Spatz, NP  I know it is difficult to learn each physician's workflow but every single one of my patients has in their problem list where we stand with their work-up including the one we talked about:  - Confirmed on CT 11/04/17 esp rml/lingula with tree in bud changes - Sputum 12/30/18 2+ smear, MAC on DNA probe  - Quant Ig's 02/05/2019  wnl with IgE 229 and RAST pos cedar trees dog/cat dust - Alpha one AT screen 02/05/2019  MM level 133   Therefore I think it is smart to try Symbicort 80 or dulera 137fr her airflow obstruction and Zithromax is a reasonable antibiotic for short-term but what she may need is long-term treatment with 2-3 drugs for 2 to 3 years and I do prefer ID to make this call and not sure why they would be willing to see her at this point since she failed Levaquin-I would consider referring her again or troubleshooting why they would not see her

## 2019-03-26 ENCOUNTER — Telehealth: Payer: Self-pay | Admitting: Internal Medicine

## 2019-03-26 NOTE — Telephone Encounter (Signed)
Dr. Melvyn Novas Bailey West wanted to know if it was you that made referral to infectious dx. She thought last visit you told her you were going to hold off. If so, it's ok but she just wanted to be sure. Informed Bailey West referral was entered by MW.  Bailey West scheduled for 04-08-2019 @ 2:00  With Dr Linus Salmons.  Assessment/Plan  Bronchiectasis with MAI colonization vs infection Bailey West states she feels better after treatment with 9 out of 10 days of Levaquin>> stopped due to tendinitis Bailey West states she does not cough secretions up at present Currently no chest congestion Currently not taking Mucinex Plan We will document in the medical record that you cannot take Levaquin due to tendonitis. Whenever you develop cough congestion take mucinex or mucinex dm > these will help keep the mucus loose and flowing but if your condition worsens you need to seek help immediately preferably here or somewhere inside the [facility] system to compare xrays ( worse = darker or bloody mucus or pain on breathing in) Your Pulmonary Function Test shows you may have an element of asthma that could be contributing to your exertional shortness of breath We will do a therapeutic trial with Symbicort 80 Take 2 puffs in the morning and 2 puffs at night Rinse your mouth/ brush your teeth  after use We will teach you how to use the inhaler. Follow up with Dr. Melvyn Novas or Judson Roch NP in 2 weeks to evaluate if you feel the Symbicort has improved your breathing.  If you feel chest congestion, please start Mucinex . Take this with a full glass of water. This should help you cough up the mucus in your lungs. Call us if you are unable to get up mucus with the mucinex. We can add a flutter valve with the mucinex at that time. If you develop fever with cough call the office, we may need to check a CXR. If you see blood in your secretions call the office or seek emergency care. Please contact office for sooner follow up if symptoms do not improve  or worsen or seek emergency care  I have spoken to Dr. Melvyn Novas, and he is considering performing a bronchoscopy for sputum culture, Gram stain, AFB, and fungal with sensitivities to allow for optimal antimicrobial treatment with flares. Bailey West will need full Pneumovax in 1 dose Prevnar 13 as per timing guidelines per PCP at follow-up visit Please contact office for sooner follow up if symptoms do not improve or worsen or seek emergency care

## 2019-03-26 NOTE — Telephone Encounter (Signed)
Yes I talked to NP about it and agreed she needs to see them

## 2019-03-26 NOTE — Telephone Encounter (Signed)
Spoke with patient. She is aware that she will need to keep the appointment. Verbalized understanding. Nothing further needed at time of call.

## 2019-04-06 ENCOUNTER — Other Ambulatory Visit: Payer: Self-pay

## 2019-04-06 ENCOUNTER — Encounter: Payer: Self-pay | Admitting: Internal Medicine

## 2019-04-06 ENCOUNTER — Ambulatory Visit: Payer: BC Managed Care – PPO | Admitting: Internal Medicine

## 2019-04-06 DIAGNOSIS — J479 Bronchiectasis, uncomplicated: Secondary | ICD-10-CM

## 2019-04-06 MED ORDER — BUDESONIDE-FORMOTEROL FUMARATE 80-4.5 MCG/ACT IN AERO: 2.0000 | INHALATION_SPRAY | Freq: Two times a day (BID) | RESPIRATORY_TRACT | 11 refills | Status: DC

## 2019-04-06 MED ORDER — BUDESONIDE-FORMOTEROL FUMARATE 80-4.5 MCG/ACT IN AERO: 2.0000 | INHALATION_SPRAY | Freq: Two times a day (BID) | RESPIRATORY_TRACT | 0 refills | Status: DC

## 2019-04-06 NOTE — Progress Notes (Signed)
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       History of Present Illness  02/05/2019  Pulmonary/ 1st office eval/Bailey West  Chief Complaint  Patient presents with  . Consult    positive MAC, cough, SOB with exertion, lungs hurt at night  Dyspnea:  Walking up to 10-15 min slt incline slows her down = MMRC1 = can walk nl pace, flat grade, can't hurry or go uphills or steps s sob   Cough: cough when gets up/ really not productive / no more blood p d/c all anticoagulants   Sleep: on side / bed is horizontal with one pillow / no am flare  SABA use: none  Chronic ant R Cp is to right of midline/ seems worse p supper or with deep breath x year or two really doesn't flare with episodes of "bronchitis" rec Bronchiectasis =   you have scarring of your bronchial tubes  Levaquin 500 mg daily x 10 days - stop if aches in tendons  You will need both pneumovax and Prevar 13 but needs to be done per guidelines     04/06/2019  f/u ov/Bailey West re:  Bronchiectaisis/ on symbicort 80 2bid  Chief Complaint  Patient presents with  . Follow-up    Breathing is doing well and no new co'.s  Dyspnea:  Walking up anywhere s stopping / some hills Cough: none  Sleeping: no respiratory flat/ on side  SABA use:  None  02: none     No obvious day to day or daytime variability or assoc excess/ purulent sputum or mucus plugs or hemoptysis or cp or chest tightness, subjective wheeze or overt sinus or hb symptoms.   Sleeping as above  without nocturnal  or early am exacerbation  of respiratory  c/o's or need for noct saba. Also denies any obvious fluctuation of symptoms with weather or environmental changes or other aggravating or alleviating factors except as outlined above   No unusual exposure hx or h/o childhood pna/ asthma or knowledge of premature birth.  Current Allergies, Complete Past Medical History, Past Surgical History, Family History, and Social History were reviewed in [employer] record.  ROS  The following are not active complaints unless bolded Hoarseness, sore throat, dysphagia, dental problems, itching, sneezing,  nasal congestion or discharge of excess mucus or purulent secretions, ear ache,   fever, chills, sweats, unintended wt loss or wt gain, classically pleuritic or exertional cp,  orthopnea pnd or arm/hand swelling  or leg swelling, presyncope, palpitations, abdominal pain, anorexia, nausea, vomiting, diarrhea  or change in bowel habits or change in bladder habits, change in stools or change in urine, dysuria, hematuria,  rash, arthralgias, visual complaints, headache, numbness, weakness or ataxia  or problems with walking or coordination,  change in mood or  memory.        Current Meds  Medication Sig  . budesonide-formoterol (SYMBICORT) 80-4.5 MCG/ACT inhaler Inhale 2 puffs into the lungs 2 (two) times a day.             Objective:     amb wf nad minimal pseudowheeze resolves with  plm   Wt Readings from Last 3 Encounters:  04/06/19 140 lb 3.2 oz (63.6 kg)  03/19/19 138 lb 9.6 oz (62.9 kg)  02/05/19 137 lb 12.8 oz (62.5 kg)     Vital signs reviewed - Note on arrival 02 sats  98% on RA       HEENT: nl dentition / oropharynx. Nl external ear canals without cough  reflex -  Mild bilateral non-specific turbinate edema     NECK :  without JVD/Nodes/TM/ nl carotid upstrokes bilaterally   LUNGS: no acc muscle use,  Mild barrel  contour chest wall with bilateral  Distant bs s audible wheeze and  without cough on insp or exp maneuver and mild  Hyperresonant  to  percussion bilaterally     CV:  RRR  no s3 or murmur or increase in P2, and no edema   ABD:  soft and nontender with pos end  insp Hoover's  in the supine position. No bruits or organomegaly appreciated, bowel sounds nl  MS:   Nl gait/  ext warm without deformities, calf tenderness, cyanosis or clubbing No obvious joint restrictions   SKIN: warm and dry without lesions    NEURO:  alert, approp, nl sensorium with  no motor or cerebellar deficits apparent.           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

## 2019-04-06 NOTE — Patient Instructions (Addendum)
Symbicort 80 Take 2 puffs first thing in am and then another 2 puffs about 12 hours later.     Work on inhaler technique:  relax and gently blow all the way out then take a nice smooth deep breath back in, triggering the inhaler at same time you start breathing in.  Hold for up to 5 seconds if you can. Blow out thru nose. Rinse and gargle with water when done    Keep your appt to see ID    Please schedule a follow up visit in 12  months but call sooner if needed

## 2019-04-06 NOTE — Progress Notes (Signed)
Chart and office note reviewed in detail  > agree with a/p as outlined    

## 2019-04-07 ENCOUNTER — Telehealth: Payer: Self-pay | Admitting: Internal Medicine

## 2019-04-07 ENCOUNTER — Encounter: Payer: Self-pay | Admitting: Internal Medicine

## 2019-04-07 NOTE — Telephone Encounter (Signed)
COVID-19 Pre-Screening Questions:04/07/19 ° °Do you currently have a fever (>100 °F), chills or unexplained body aches? NO ° °Are you currently experiencing new cough, shortness of breath, sore throat, runny nose? NO °•  °Have you recently travelled outside the state of [place] in the last 14 days? NO  °•  °Have you been in contact with someone that is currently pending confirmation of Covid19 testing or has been confirmed to have the Covid19 virus?  NO  ° °**If the patient answers NO to ALL questions -  advise the patient to please call the clinic before coming to the office should any symptoms develop.  ° ° °

## 2019-04-07 NOTE — Assessment & Plan Note (Signed)
Onset of symptoms around 2010  - Confirmed on CT 11/04/17 esp rml/lingula with tree in bud changes - Sputum 12/30/18 2+ smear, MAC on DNA probe  - Quant Ig's 02/05/2019  wnl with IgE 229 and RAST pos cedar trees dog/cat dust - Alpha one AT screen 02/05/2019  MM level 133  - PFT's  03/19/2019  FEV1 1.68 (71 % ) ratio 0.69  p 11 % improvement from saba p nothing prior to study with DLCO  96/96c % corrects to 118 % for alv volume   - 04/06/2019  After extensive coaching inhaler device,  effectiveness =    75% (short ti)   Improved on symb 80 despite suboptimal hfa and has appt for ID eval as can't tol levqaquin in short courses and needs either better short course option or commit to long term rx   Discussed in detail all the  indications, usual  risks and alternatives  relative to the benefits with patient who agrees to proceed with w/u as outlined.    F/u here can be q 12 m sooner prn   I had an extended discussion with the patient reviewing all relevant studies completed to date and  lasting 15 to 20 minutes of a 25 minute visit    See device teaching which extended face to face time for this visit.  Each maintenance medication was reviewed in detail including emphasizing most importantly the difference between maintenance and prns and under what circumstances the prns are to be triggered using an action plan format that is not reflected in the computer generated alphabetically organized AVS which I have not found useful in most complex patients, especially with respiratory illnesses  Please see AVS for specific instructions unique to this visit that I personally wrote and verbalized to the the pt in detail and then reviewed with pt  by my nurse highlighting any  changes in therapy recommended at today's visit to their plan of care.

## 2019-04-08 ENCOUNTER — Ambulatory Visit (INDEPENDENT_AMBULATORY_CARE_PROVIDER_SITE_OTHER): Payer: BC Managed Care – PPO | Admitting: Internal Medicine

## 2019-04-08 ENCOUNTER — Encounter: Payer: Self-pay | Admitting: Internal Medicine

## 2019-04-08 ENCOUNTER — Other Ambulatory Visit: Payer: Self-pay

## 2019-04-08 VITALS — BP 107/65 | HR 78 | Temp 97.7°F | Wt 140.0 lb

## 2019-04-08 DIAGNOSIS — J479 Bronchiectasis, uncomplicated: Secondary | ICD-10-CM | POA: Diagnosis not present

## 2019-04-08 NOTE — Progress Notes (Signed)
Patient ID: Bailey West, female   DOB: 05-21-1956, 63 y.o.   MRN: [id number]     [facility]      Reason for Consult: MAI positive culture and bronchiectasis    Referring Physician: Dr. Sherene SiresWert    Patient ID: Bailey MontgomeryPeggy West, female    DOB: 05-21-1956, 63 y.o.   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  She was referred to Dr. Sherene SiresWert after a sputum done by her PCP was positive for MAI.  She has not had a follow up sputum at any time.  She reports no real cough or sputum production.  He only symptom is some discomfort she feels in her chest which has resolved and sinus draiange.  She reports that when she gets sinus draiange, she develops more of a cough but does not report any exacerbations of bronchiectatic disease.  She in fact was surprised anything grew on her sputum before as she doesn't produce any sputum. Her sinus disease also has improved since removing allergens from her home.  She does not smoke or around second hand smoke.  No fever, no chills, no night sweats.  No hypoxia.  Previous record reviewed in Epic including the CT scan pulmonary visit.   Past Medical History:  Diagnosis Date  . Pneumonia    recurrent     Prior to Admission medications   Medication Sig Start Date End Date Taking? Authorizing Provider  budesonide-formoterol (SYMBICORT) 80-4.5 MCG/ACT inhaler Inhale 2 puffs into the lungs 2 (two) times a day. 04/06/19  Yes Nyoka CowdenWert, Michael B, MD  ipratropium-albuterol (DUONEB) 0.5-2.5 (3) MG/3ML SOLN Take 3 mLs by nebulization.   Yes [provider]  rizatriptan (MAXALT) 5 MG tablet Take 5 mg by mouth as needed for migraine. May repeat in 2 hours if needed   Yes [provider]    Allergies  Allergen Reactions  . Levaquin  [Levofloxacin] Other (See Comments)    Tendonitis    Social History   Tobacco Use  . Smoking status: Former Smoker    Packs/day: 0.25    Types: Cigarettes  . Smokeless tobacco: Never Used  . Tobacco comment: 2 years 30 years ago  Substance Use Topics  . Alcohol use: Never    Frequency: Never  . Drug use: Never   FMH: no immunosuppressive diseases  Review of Systems  Constitutional: negative for fevers, chills, sweats, fatigue, malaise, anorexia and weight loss Eyes: negative for redness Ears, nose, mouth, throat, and face: negative for ear drainage Respiratory: negative for cough, sputum, hemoptysis, pleurisy/chest pain, wheezing or dyspnea on exertion Gastrointestinal: negative for nausea and diarrhea Integument/breast: negative for rash Musculoskeletal: negative for myalgias and arthralgias Neurological: negative for headaches and dizziness Behavioral/Psych: negative for anxiety Allergic/Immunologic: positive for sinus drainage, negative for hay fever All other systems reviewed and are negative    Constitutional: in no apparent distress  Vitals:   04/08/19 1342  BP: 107/65  Pulse: 78  Temp: 97.7 F (36.5 C)   EYES: anicteric ENMT:no thrush Cardiovascular: Cor RRR Respiratory: CTA B; normal respiratory effort GI: Bowel sounds are normal, liver is not enlarged, spleen is not enlarged Musculoskeletal: no clubing Skin: negatives: no rash Hematologic: no cervical lad  Labs: Lab Results  Component Value Date   WBC 9.0 02/05/2019   HGB 13.4 02/05/2019  HCT 39.7 02/05/2019   MCV 92.8 02/05/2019   PLT 247.0 02/05/2019    Lab Results  Component Value Date   CREATININE 0.77 01/31/2018   BUN 23 (H) 01/31/2018   NA 136 01/31/2018   K 4.6 01/31/2018   CL 105 01/31/2018   CO2 26 01/31/2018   No results found for: ALT, AST, GGT, ALKPHOS, BILITOT, INR   Assessment: bronchiectasis and MAI growth on one culture.  The significance of one growth is unclear and she  has no symptoms related to her lung disease such as cough, sputum production or SOB.  Her only real symptom appears to be just her sinus drainage which has been on chronic problem.  I think addressing her sinus disease would be indicated though she has been symptom free from this for awhile.    Plan: 1) repeat sputum for MAI if she is able to produce a sample 2) no indication for treatment for MAI at this time Continue with other medications per Dr. Melvyn Novas Follow up in two months.

## 2019-06-04 ENCOUNTER — Telehealth: Payer: Self-pay

## 2019-06-04 NOTE — Telephone Encounter (Signed)
Received incoming call from patient stating she is unable to produce any sputum and is not longer feeling congestion. Patient wants to know if she still needs to follow up with Dr. Linus Salmons at this time. Routing to provider for advise. Eugenia Mcalpine

## 2019-06-08 NOTE — Telephone Encounter (Signed)
No follow up needed, if she is not producing much, not likely to need treatment so can cancel the sputum AFB.  She just needs to follow up with pulmonary then.  thanks

## 2019-06-08 NOTE — Telephone Encounter (Signed)
Called patient and left voicemail "no need for follow up with Dr. Linus Salmons" "follow up with pulmonary". Bailey West

## 2019-06-09 ENCOUNTER — Ambulatory Visit: Payer: BC Managed Care – PPO | Admitting: Internal Medicine

## 2019-10-19 IMAGING — DX DG CHEST 2V
2 series · 2 of 2 positions shown · non-contrast
Comparison: 10/31/2017

CLINICAL DATA: Left-sided chest pain

EXAM:
CHEST - 2 VIEW

[chest pa]
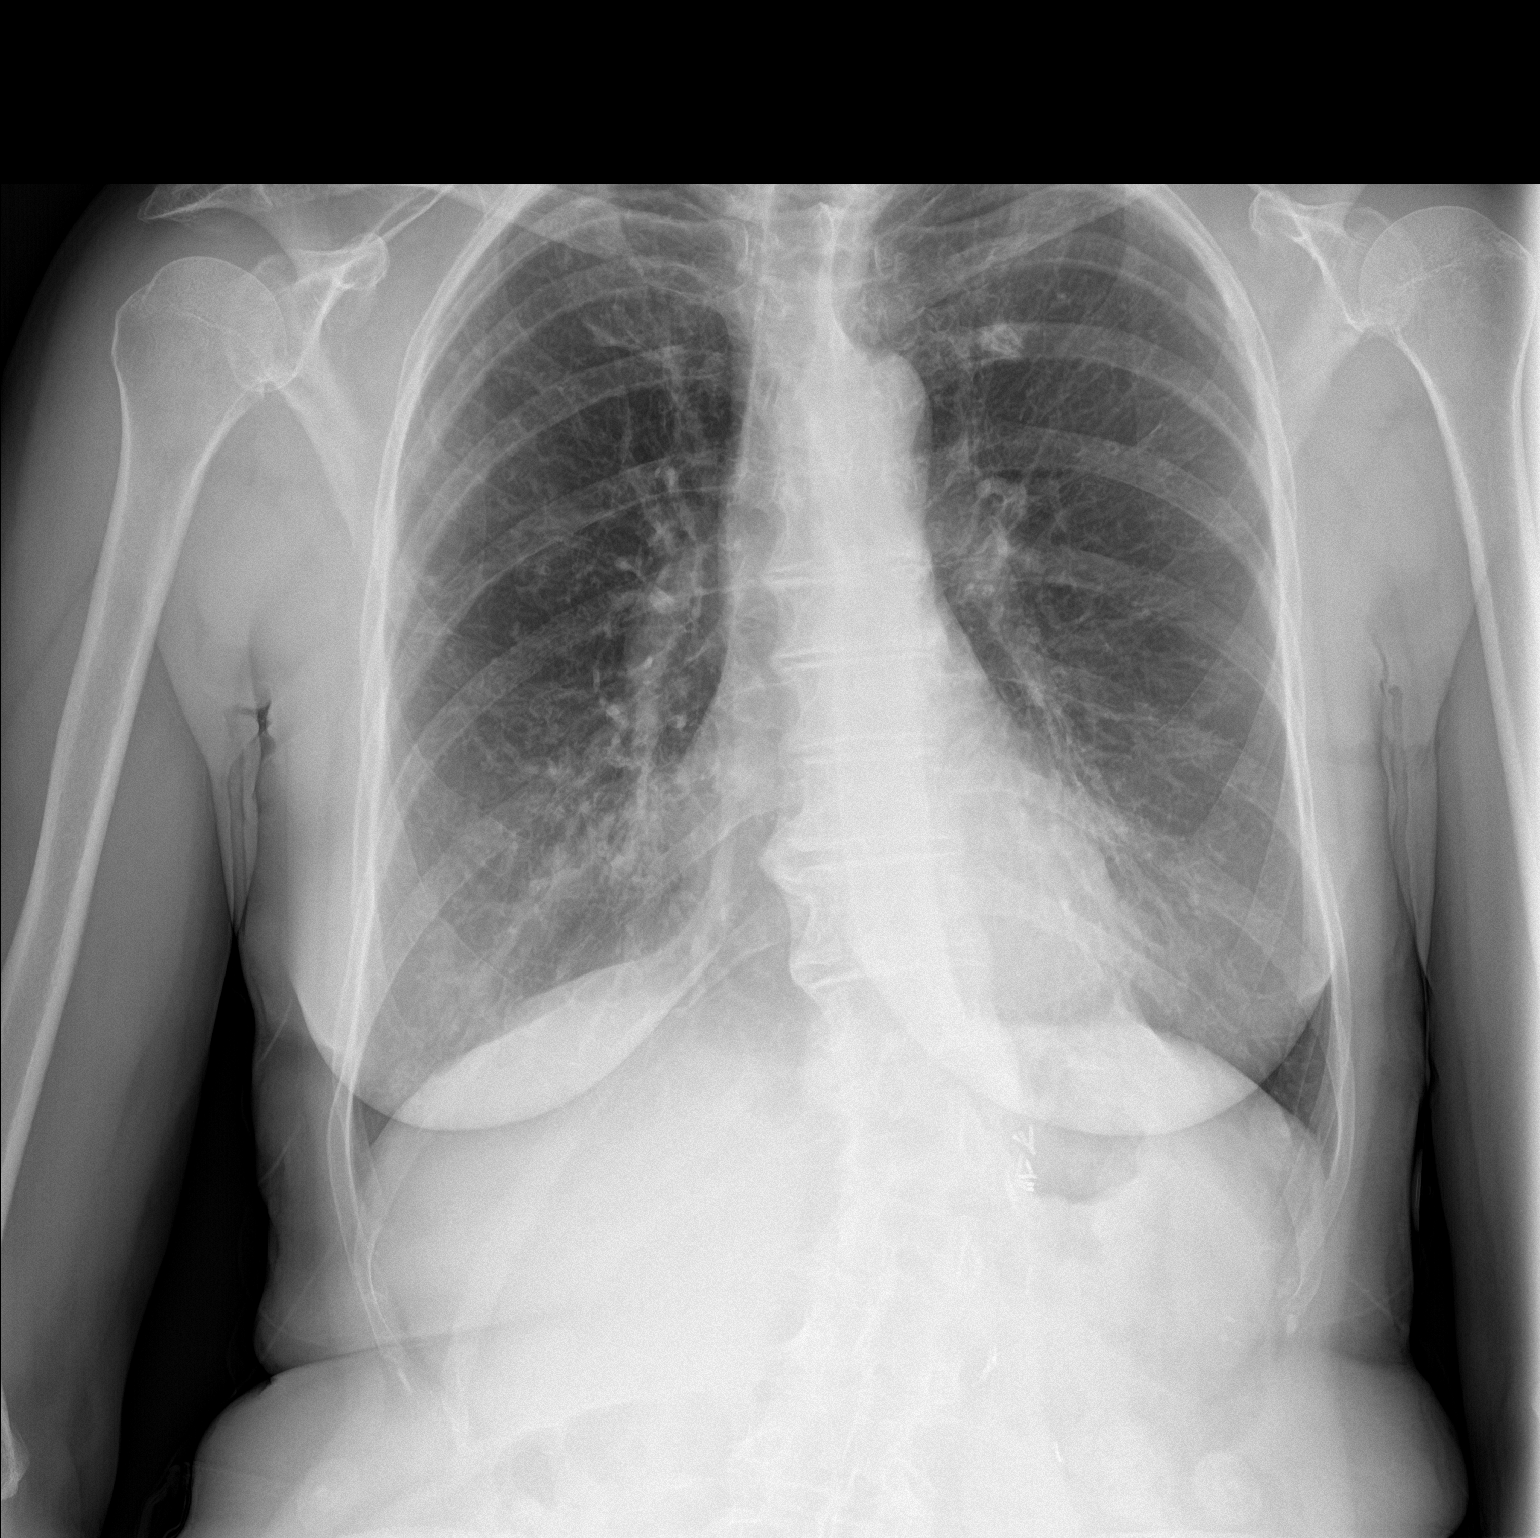

[chest lat]
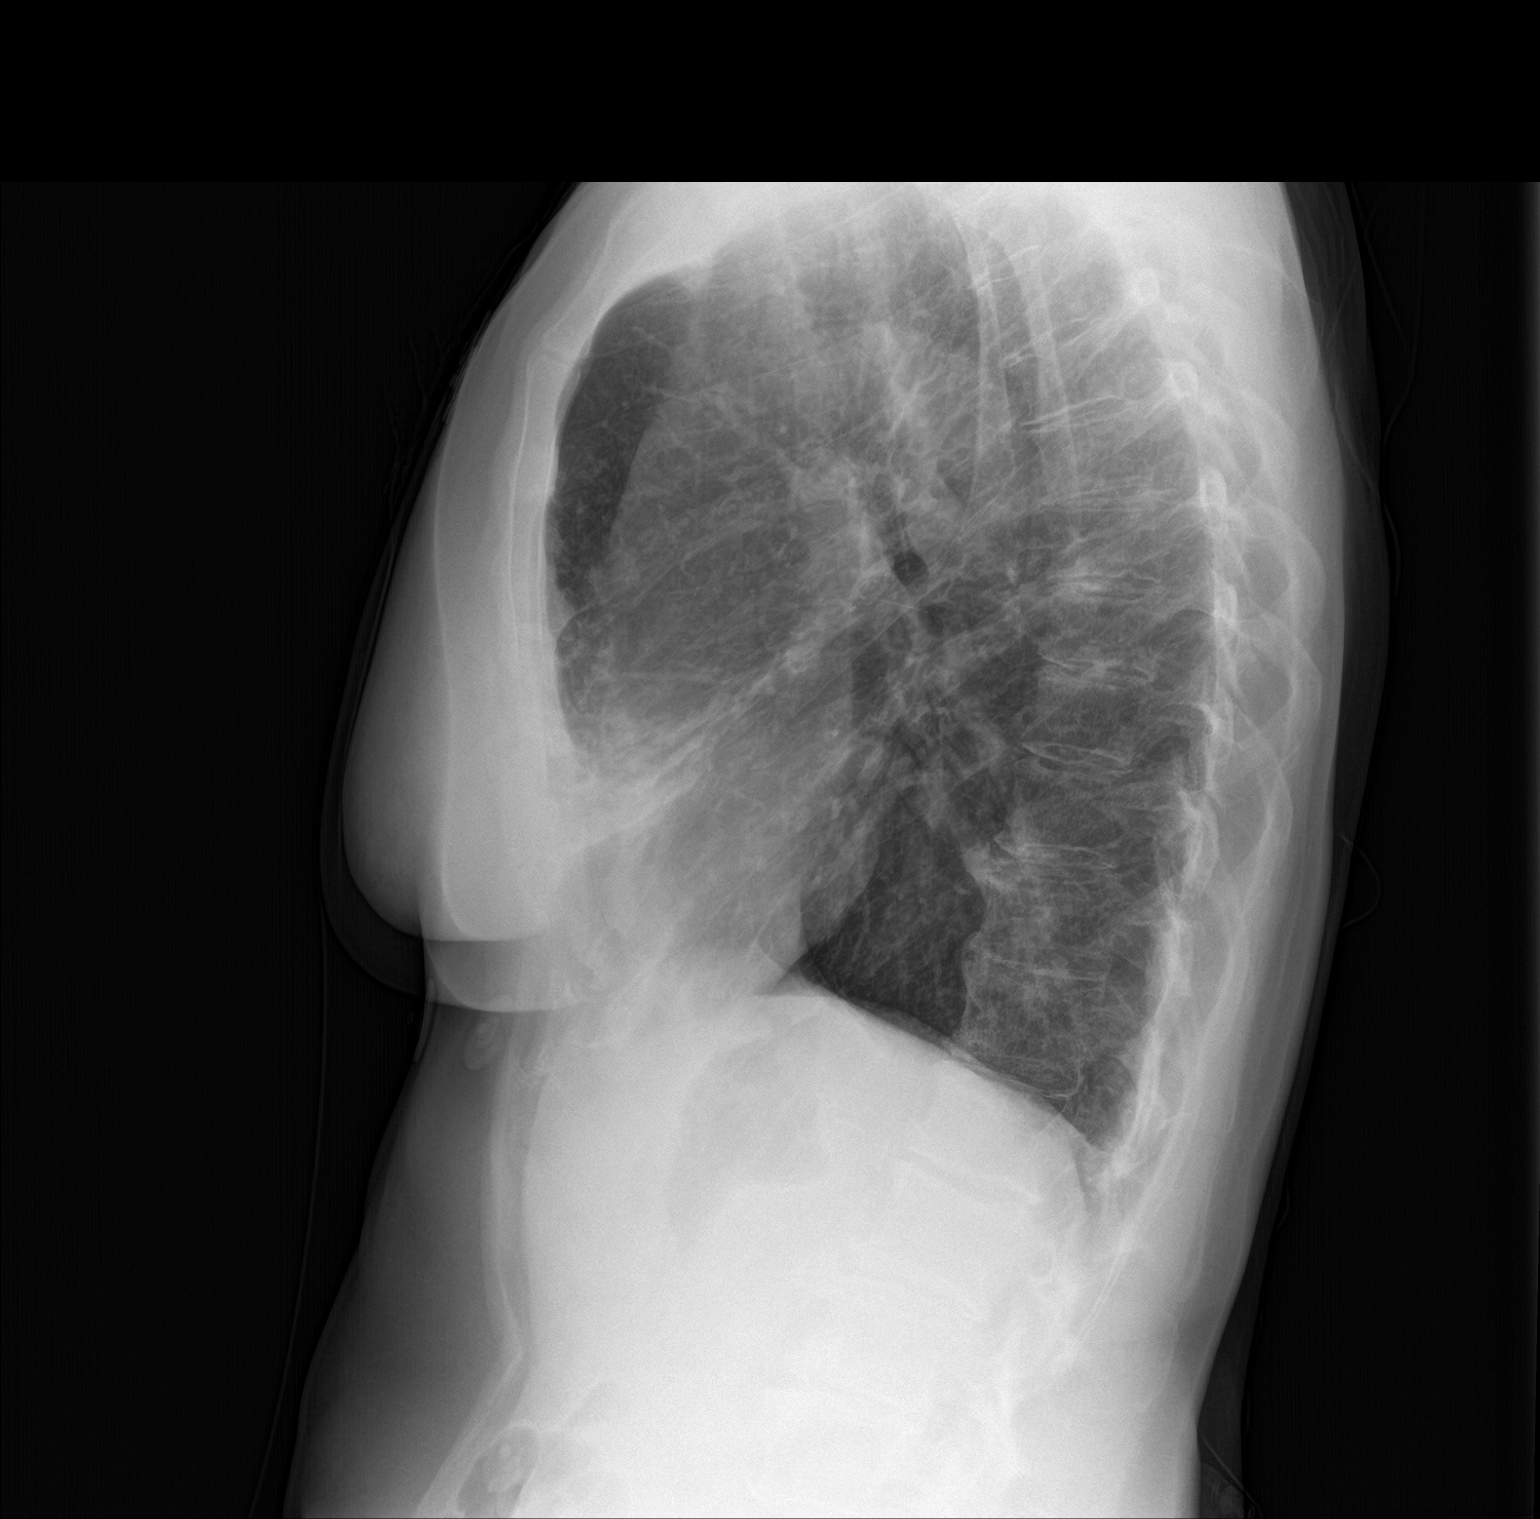

[2 of 2 positions shown; findings below may reference images not displayed]

FINDINGS: Cardiac shadow is within normal limits. Postsurgical changes in the
upper abdomen are seen. The lungs are well aerated bilaterally. Few
scattered calcified granulomas are seen. Stable scarring in the
right middle lobe is noted. No acute infiltrate or sizable effusion
is noted. No acute bony abnormality is seen.
IMPRESSION: Chronic changes without acute abnormality

## 2020-04-05 ENCOUNTER — Ambulatory Visit: Payer: BC Managed Care – PPO | Admitting: Internal Medicine

## 2020-04-26 ENCOUNTER — Other Ambulatory Visit: Payer: Self-pay | Admitting: Internal Medicine

## 2020-05-09 ENCOUNTER — Other Ambulatory Visit: Payer: Self-pay

## 2020-05-09 ENCOUNTER — Ambulatory Visit (INDEPENDENT_AMBULATORY_CARE_PROVIDER_SITE_OTHER): Payer: Medicare Other | Admitting: Internal Medicine

## 2020-05-09 ENCOUNTER — Encounter: Payer: Self-pay | Admitting: Internal Medicine

## 2020-05-09 DIAGNOSIS — J479 Bronchiectasis, uncomplicated: Secondary | ICD-10-CM | POA: Diagnosis not present

## 2020-05-09 MED ORDER — BUDESONIDE-FORMOTEROL FUMARATE 80-4.5 MCG/ACT IN AERO: INHALATION_SPRAY | RESPIRATORY_TRACT | 1 refills | Status: DC

## 2020-05-09 NOTE — Patient Instructions (Signed)
No change medications   Please schedule a follow up visit in 12  months but call sooner if needed    

## 2020-05-09 NOTE — Progress Notes (Signed)
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   History of Present Illness  02/05/2019  Pulmonary/ 1st office eval/Bailey West  Chief Complaint  Patient presents with   Consult    positive MAC, cough, SOB with exertion, lungs hurt at night  Dyspnea:  Walking up to 10-15 min slt incline slows her down = MMRC1 = can walk nl pace, flat grade, can't hurry or go uphills or steps s sob   Cough: cough when gets up/ really not productive / no more blood p d/c all anticoagulants   Sleep: on side / bed is horizontal with one pillow / no am flare  SABA use: none  Chronic ant R Cp is to right of midline/ seems worse p supper or with deep breath x year or two really doesn't flare with episodes of "bronchitis" rec Bronchiectasis =   you have scarring of your bronchial tubes  Levaquin 500 mg daily x 10 days - stop if aches in tendons  You will need both pneumovax and Prevar 13 but needs to be done per guidelines     04/06/2019  f/u ov/Bailey West re:  Bronchiectaisis/ on symbicort 80 2bid  Chief Complaint  Patient presents with   Follow-up    Breathing is doing well and no new co'.s  Dyspnea:  Walking up anywhere s stopping / some hills Cough: none  Sleeping: no respiratory flat/ on side  SABA use:  None  02: none   rec Symbicort 80 Take 2 puffs first thing in am and then another 2 puffs about 12 hours later.  Work on inhaler technique:   Keep your appt to see ID > Bailey West did not rec rex       05/09/2020  f/u ov/Bailey West re: bronchiectasis/ symb 52 2bid  Chief Complaint  Patient presents with   Follow-up    pt has no compliants  Dyspnea:  Not limited by breathing from desired activities   Cough: none  Sleeping: flat bed/ on side / one pillow SABA use: none / has neb not needed  02: none    No obvious day to day or daytime variability or assoc excess/ purulent sputum or mucus plugs or hemoptysis or cp or chest tightness, subjective wheeze or overt sinus or hb symptoms.   Sleeping  without nocturnal  or early am exacerbation  of respiratory  c/o's or need for noct saba. Also denies any obvious fluctuation of symptoms with weather or environmental changes or other aggravating or alleviating factors except as outlined above   No unusual exposure hx or h/o childhood pna/ asthma or knowledge of premature birth.  Current Allergies, Complete Past Medical History, Past Surgical History, Family History, and Social History were reviewed  in [employer] record.  ROS  The following are not active complaints unless bolded Hoarseness, sore throat, dysphagia, dental problems, itching, sneezing,  nasal congestion or discharge of excess mucus or purulent secretions, ear ache,   fever, chills, sweats, unintended wt loss or wt gain, classically pleuritic or exertional cp,  orthopnea pnd or arm/hand swelling  or leg swelling, presyncope, palpitations, abdominal pain, anorexia, nausea, vomiting, diarrhea  or change in bowel habits or change in bladder habits, change in stools or change in urine, dysuria, hematuria,  rash, arthralgias, visual complaints, headache, numbness, weakness or ataxia or problems with walking or coordination,  change in mood or  memory.        Current Meds  Medication Sig    budesonide-formoterol (SYMBICORT) 80-4.5 MCG/ACT inhaler INHALE 2 PUFFS INTO LUNGS TWICE DAILY (RINSE, GARGLE & SPIT AFTER USE)   ipratropium-albuterol (DUONEB) 0.5-2.5 (3) MG/3ML SOLN Take 3 mLs by nebulization.   rizatriptan (MAXALT) 5 MG tablet Take 5 mg by mouth as needed for migraine. May repeat in 2 hours if needed           Objective:     amb pleasant wf nad - looks younger than stated age  42/16/2021        144  04/06/19 140 lb 3.2 oz (63.6 kg)  03/19/19 138 lb 9.6 oz (62.9 kg)  02/05/19 137 lb 12.8 oz (62.5 kg)      Vital signs reviewed  05/09/2020  - Note at rest 02 sats  97% on RA     HEENT : pt wearing mask not removed for exam due to covid - 19 concerns.   NECK :  without JVD/Nodes/TM/ nl carotid upstrokes bilaterally   LUNGS: no acc muscle use,  Min barrel  contour chest wall with bilateral  slightly decreased bs s audible wheeze and  without cough on insp or exp maneuvers and min  Hyperresonant  to  percussion bilaterally     CV:  RRR  no s3 or murmur or increase in P2, and no edema   ABD:  soft and nontender with pos end  insp Hoover's  in the supine position. No bruits or organomegaly appreciated, bowel sounds nl  MS:   Nl gait/  ext warm without deformities, calf tenderness, cyanosis or clubbing No obvious joint restrictions   SKIN: warm and dry without lesions    NEURO:  alert, approp, nl sensorium with  no motor or cerebellar deficits apparent.                   Assessment

## 2020-05-09 NOTE — Assessment & Plan Note (Addendum)
Onset of symptoms around 2010  - Confirmed on CT 11/04/17 esp rml/lingula with tree in bud changes - Sputum 12/30/18 2+ smear, MAC on DNA probe  - Quant Ig's 02/05/2019  wnl with IgE 229 and RAST pos cedar trees dog/cat dust - Alpha one AT screen 02/05/2019  MM level 133  - PFT's  03/19/2019  FEV1 1.68 (71 % ) ratio 0.69  p 11 % improvement from saba p nothing prior to study with DLCO  96/96c % corrects to 118 % for alv volume   - 04/06/2019  After extensive coaching inhaler device,  effectiveness =    75% (short ti)  - 04/08/2019 [facility] eval no indication for rx   - The proper method of use, as well as anticipated side effects, of a metered-dose inhaler are discussed and demonstrated to the patient.     Adequate control on present rx, reviewed in detail with pt > no change in rx needed - contingencies discussed including possible need for abx for flares  zpak or doxy would be fine with me   Return yearl is approp, call sooner prn    Each maintenance medication was reviewed in detail including emphasizing most importantly the difference between maintenance and prns and under what circumstances the prns are to be triggered using an action plan format where appropriate.  Total time for H and P, chart review, counseling, teaching device and generating customized AVS unique to this office visit / charting = 15 min

## 2021-03-14 ENCOUNTER — Other Ambulatory Visit: Payer: Self-pay | Admitting: Internal Medicine

## 2021-05-09 ENCOUNTER — Ambulatory Visit: Payer: Medicare Other | Admitting: Internal Medicine

## 2022-04-11 ENCOUNTER — Other Ambulatory Visit: Payer: Self-pay | Admitting: Internal Medicine
